# Patient Record
Sex: Male | Born: 1960 | Race: White | Hispanic: No | Marital: Married | Smoking: Never smoker
Health system: Southern US, Community
[De-identification: ages and names within clinical notes are randomized; demographics above are authoritative.]

## PROBLEM LIST (undated history)

## (undated) DIAGNOSIS — R7303 Prediabetes: Secondary | ICD-10-CM

## (undated) DIAGNOSIS — G4730 Sleep apnea, unspecified: Secondary | ICD-10-CM

## (undated) DIAGNOSIS — C8590 Non-Hodgkin lymphoma, unspecified, unspecified site: Secondary | ICD-10-CM

---

## 2004-03-30 ENCOUNTER — Encounter: Admission: RE | Admit: 2004-03-30 | Discharge: 2004-03-30 | Payer: Self-pay | Admitting: Family Medicine
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
## 2008-05-02 ENCOUNTER — Emergency Department (HOSPITAL_COMMUNITY): Admission: EM | Admit: 2008-05-02 | Discharge: 2008-05-03 | Payer: Self-pay | Admitting: Emergency Medicine

## 2010-06-18 IMAGING — CR DG CHEST 1V PORT
1 series · 1 of 1 positions shown · non-contrast
Comparison: Portable exam 7772 hours without priors for comparison.

CLINICAL DATA: Chest pain

PORTABLE CHEST - 1 VIEW

[AP]
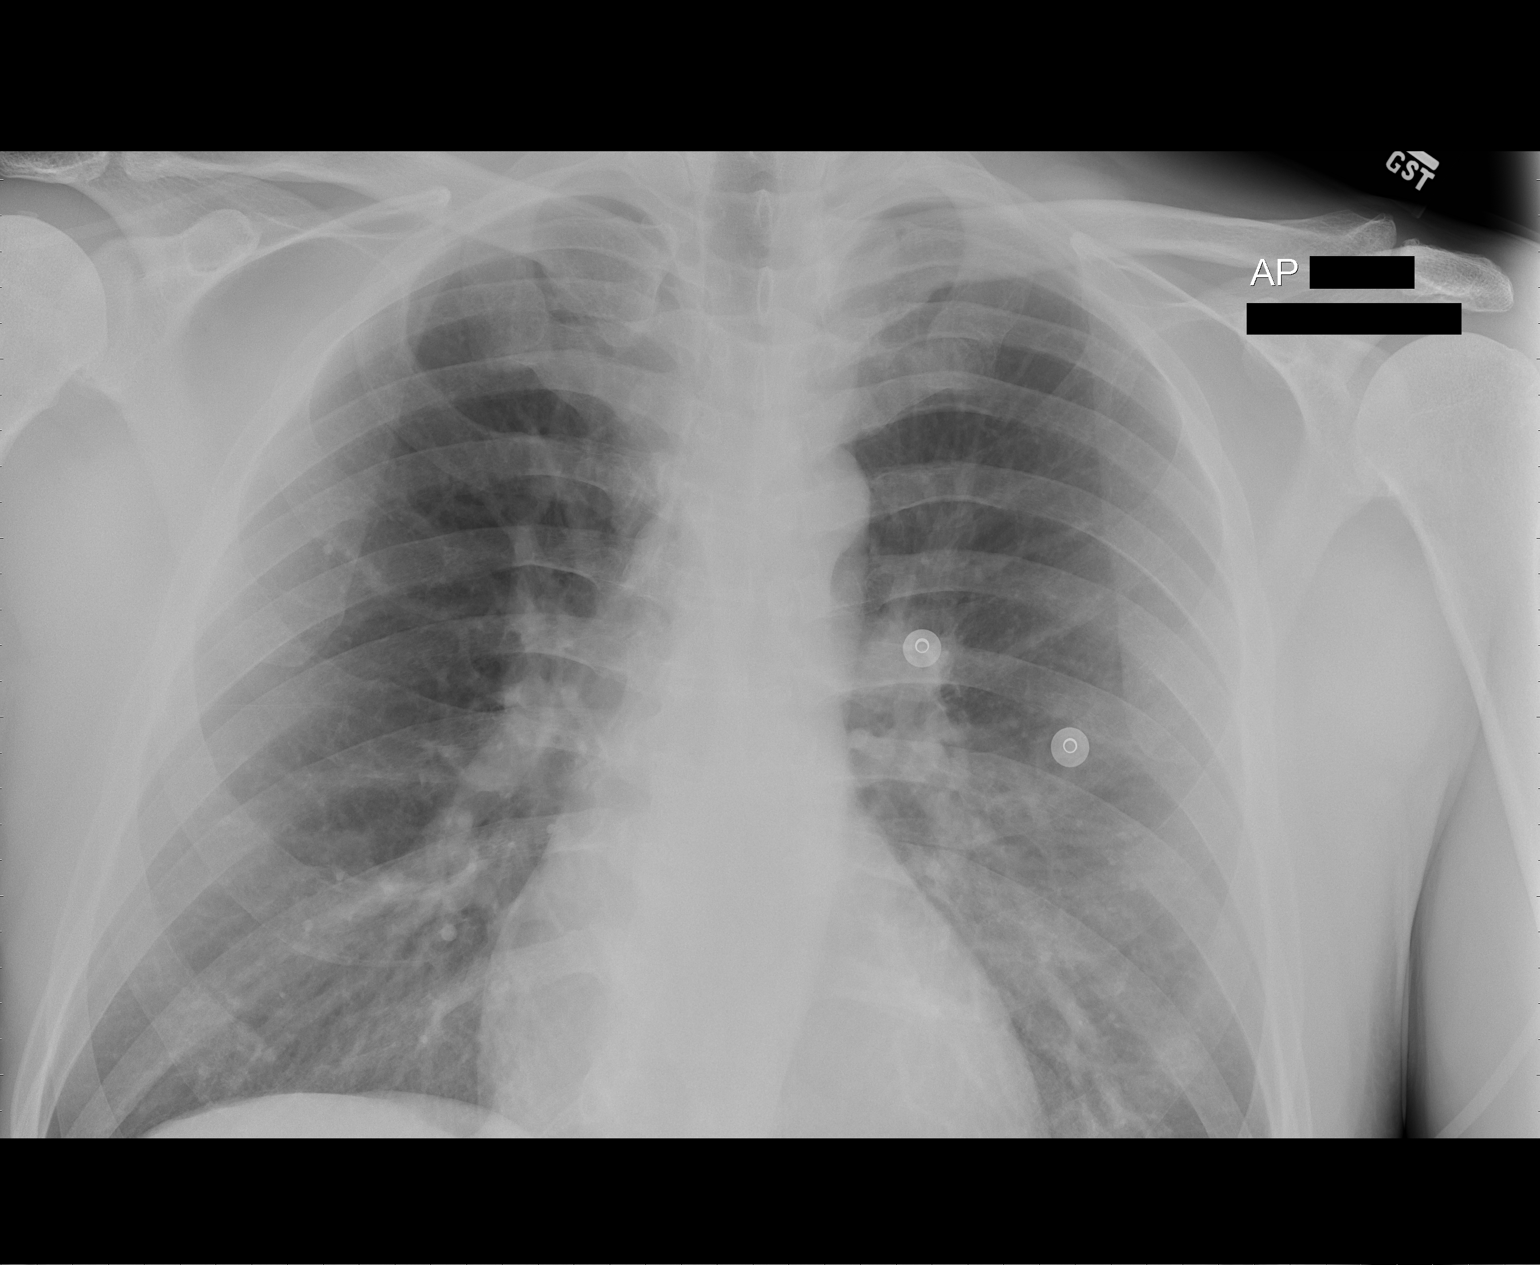

[1 of 1 positions shown; findings below may reference images not displayed]

FINDINGS: Normal heart size, mediastinal contours, and pulmonary vascularity
for technique.
Lungs clear.
No pleural effusion or pneumothorax.
IMPRESSION: No acute abnormalities.

## 2011-04-07 ENCOUNTER — Emergency Department (INDEPENDENT_AMBULATORY_CARE_PROVIDER_SITE_OTHER): Payer: BC Managed Care – PPO

## 2011-04-07 ENCOUNTER — Encounter: Payer: Self-pay | Admitting: Emergency Medicine

## 2011-04-07 ENCOUNTER — Other Ambulatory Visit: Payer: Self-pay

## 2011-04-07 ENCOUNTER — Emergency Department (HOSPITAL_BASED_OUTPATIENT_CLINIC_OR_DEPARTMENT_OTHER)
Admission: EM | Admit: 2011-04-07 | Discharge: 2011-04-07 | Disposition: A | Discharge status: Home / Self Care | Payer: BC Managed Care – PPO | Attending: Emergency Medicine | Admitting: Emergency Medicine

## 2011-04-07 DIAGNOSIS — Z79899 Other long term (current) drug therapy: Secondary | ICD-10-CM | POA: Insufficient documentation

## 2011-04-07 DIAGNOSIS — R5381 Other malaise: Secondary | ICD-10-CM | POA: Insufficient documentation

## 2011-04-07 DIAGNOSIS — R112 Nausea with vomiting, unspecified: Secondary | ICD-10-CM

## 2011-04-07 DIAGNOSIS — R55 Syncope and collapse: Secondary | ICD-10-CM | POA: Insufficient documentation

## 2011-04-07 DIAGNOSIS — K5289 Other specified noninfective gastroenteritis and colitis: Secondary | ICD-10-CM

## 2011-04-07 HISTORY — DX: Prediabetes: R73.03

## 2011-04-07 LAB — DIFFERENTIAL
Basophils Absolute: 0 10*3/uL (ref 0.0–0.1)
Eosinophils Relative: 1 % (ref 0–5)
Lymphocytes Relative: 12 % (ref 12–46)
Lymphs Abs: 1.1 10*3/uL (ref 0.7–4.0)
Monocytes Relative: 4 % (ref 3–12)
Neutro Abs: 8.1 10*3/uL — ABNORMAL HIGH (ref 1.7–7.7)

## 2011-04-07 LAB — COMPREHENSIVE METABOLIC PANEL
ALT: 22 U/L (ref 0–53)
AST: 22 U/L (ref 0–37)
Creatinine, Ser: 0.7 mg/dL (ref 0.50–1.35)
Potassium: 3.8 mEq/L (ref 3.5–5.1)
Sodium: 140 mEq/L (ref 135–145)
Total Bilirubin: 2.2 mg/dL — ABNORMAL HIGH (ref 0.3–1.2)

## 2011-04-07 LAB — CBC
Hemoglobin: 14.7 g/dL (ref 13.0–17.0)
MCH: 30.9 pg (ref 26.0–34.0)

## 2011-04-07 MED ORDER — ONDANSETRON HCL 4 MG/2ML IJ SOLN
4.0000 mg | Freq: Once | INTRAMUSCULAR | Status: AC
  Administered 2011-04-07: 4 mg via INTRAVENOUS

## 2011-04-07 MED ORDER — SODIUM CHLORIDE 0.9 % IV SOLN
Freq: Once | INTRAVENOUS | Status: AC
  Administered 2011-04-07: 20:00:00 via INTRAVENOUS

## 2011-04-07 MED ORDER — ONDANSETRON HCL 4 MG PO TABS: 4.0000 mg | ORAL_TABLET | Freq: Four times a day (QID) | ORAL | Status: AC

## 2011-04-07 MED ORDER — ONDANSETRON HCL 4 MG/2ML IJ SOLN
INTRAMUSCULAR | Status: AC
  Filled 2011-04-07: qty 2

## 2011-04-07 NOTE — ED Notes (Signed)
Water given to drink per pt request in order to provide urine sample

## 2011-04-07 NOTE — ED Provider Notes (Signed)
History     CSN: [id number] Arrival date & time: 04/07/2011  5:38 PM Scribed for Eric Cooper III, MD, the patient was seen in room MHT14/MHT14. This chart was scribed by Katha Cabal. This patient's care was started at 6:54PM.   Chief Complaint  Patient presents with  . Weakness  . Near Syncope   HPI Eric Richard is a 50 y.o. male who presents to the Emergency Department complaining of N/V onset this am with associated sinus headache, slight sore throat, weakness, light headed, "wobbly" gait. Pt tolerated breakfast.  Denies fever, ear ache, coughing, chest pain, chest palpitations, diarrhea, dysuria, rash, tick bite, recent sick contacts.  Pt adds that he traveled to [place] a couple weeks ago and ate ginger chicken at [employer].  Denies fever ear ache, coughing, chest pain, chest palpitations, diarrhea, dysuria, rash, tick bite, recent sick contacts.      HPI ELEMENTS:  Onset: this am Duration: persistent since onset Modifying factors: Aggravating by standing Context: as above  Associated symptoms: sinus headache, slight sore throat, weakness, light headed, "wobbly" gait. Pt tolerated breakfast.  Denies frebrile, ear ache, coughing, chest pain, chest palpitations, diarrhea, dysuria, rash, tick bite, recent sick contacts  PAST MEDICAL HISTORY:  Past Medical History  Diagnosis Date  . Borderline diabetes     PAST SURGICAL HISTORY:  History reviewed. No pertinent past surgical history.  MEDICATIONS:  Previous Medications   AMINO ACIDS (AMINO ACID PO)    Take 1 tablet by mouth daily.     CROMOLYN (NASALCROM) 5.2 MG/ACT NASAL SPRAY    Place 1 spray into the nose 2 (two) times daily.     MULTIPLE VITAMIN (MULTIVITAMIN) TABLET    Take 1 tablet by mouth daily.     PSEUDOEPHEDRINE (SUDAFED) 60 MG TABLET    Take 60 mg by mouth every 4 (four) hours as needed. congestion    SAW PALMETTO 500 MG CAPSULE    Take 500 mg by mouth daily.       ALLERGIES:  Allergies as of  04/07/2011  . (No Known Allergies)     FAMILY HISTORY:  Family History  Problem Relation Age of Onset  . Heart attack Father      SOCIAL HISTORY: History   Social History  . Marital Status: Married    Spouse Name: N/A    Number of Children: N/A  . Years of Education: N/A   Social History Main Topics  . Smoking status: Never Smoker   . Smokeless tobacco: None  . Alcohol Use: Yes     social  . Drug Use: No  . Sexually Active:    Other Topics Concern  . None   Social History Narrative  . None     Review of Systems 10 Systems reviewed and are negative for acute change except as noted in the HPI.  Physical Exam  BP 135/86  Pulse 60  Temp(Src) 97.8 F (36.6 C) (Oral)  Resp 20  SpO2 98%  Physical Exam  Nursing note and vitals reviewed. Constitutional: He is oriented to person, place, and time. He appears well-developed and well-nourished. No distress.  HENT:  Head: Normocephalic and atraumatic.  Eyes: EOM are normal. Pupils are equal, round, and reactive to light.       No Nystagmus.   Neck: Normal range of motion. Neck supple.  Cardiovascular: Normal rate, regular rhythm and normal heart sounds.   No murmur heard. Pulmonary/Chest: Effort normal and breath sounds normal. He has no wheezes.  Abdominal:  Soft. Bowel sounds are normal. There is tenderness (diffusely tender). There is no rebound, no guarding and no CVA tenderness.  Musculoskeletal: Normal range of motion.  Neurological: He is alert and oriented to person, place, and time. No sensory deficit.  Reflex Scores:      Tricep reflexes are 2+ on the right side and 2+ on the left side.      Bicep reflexes are 2+ on the right side and 2+ on the left side.      Brachioradialis reflexes are 2+ on the right side and 2+ on the left side.      Patellar reflexes are 2+ on the right side and 2+ on the left side.      Achilles reflexes are 2+ on the right side and 2+ on the left side. Skin: Skin is warm and dry.    Psychiatric: He has a normal mood and affect. His behavior is normal.    ED Course  Procedures   OTHER DATA REVIEWED: Nursing notes, vital signs, and past medical records reviewed.    DIAGNOSTIC STUDIES: Oxygen Saturation is 98% on room air, normal by my interpretation.     Date: 04/07/2011  Rate: 62  Rhythm: normal sinus rhythm  QRS Axis: normal  Intervals: normal  ST/T Wave abnormalities: normal  Conduction Disutrbances: Incomplete right bundle branch block.  Narrative Interpretation: Borderline EKG.  Old EKG Reviewed: no change.  LABS / RADIOLOGY: Results for orders placed during the hospital encounter of 04/07/11  CBC      Component Value Range   WBC 9.7  4.0 - 10.5 (K/uL)   RBC 4.76  4.22 - 5.81 (MIL/uL)   Hemoglobin 14.7  13.0 - 17.0 (g/dL)   HCT 13.0  86.5 - 78.4 (%)   MCV 84.7  78.0 - 100.0 (fL)   MCH 30.9  26.0 - 34.0 (pg)   MCHC 36.5 (*) 30.0 - 36.0 (g/dL)   RDW 69.6  29.5 - 28.4 (%)   Platelets 195  150 - 400 (K/uL)  DIFFERENTIAL      Component Value Range   Neutrophils Relative 83 (*) 43 - 77 (%)   Neutro Abs 8.1 (*) 1.7 - 7.7 (K/uL)   Lymphocytes Relative 12  12 - 46 (%)   Lymphs Abs 1.1  0.7 - 4.0 (K/uL)   Monocytes Relative 4  3 - 12 (%)   Monocytes Absolute 0.4  0.1 - 1.0 (K/uL)   Eosinophils Relative 1  0 - 5 (%)   Eosinophils Absolute 0.1  0.0 - 0.7 (K/uL)   Basophils Relative 0  0 - 1 (%)   Basophils Absolute 0.0  0.0 - 0.1 (K/uL)  COMPREHENSIVE METABOLIC PANEL      Component Value Range   Sodium 140  135 - 145 (mEq/L)   Potassium 3.8  3.5 - 5.1 (mEq/L)   Chloride 103  96 - 112 (mEq/L)   CO2 27  19 - 32 (mEq/L)   Glucose, Bld 137 (*) 70 - 99 (mg/dL)   BUN 12  6 - 23 (mg/dL)   Creatinine, Ser 1.32  0.50 - 1.35 (mg/dL)   Calcium 9.5  8.4 - 44.0 (mg/dL)   Total Protein 7.2  6.0 - 8.3 (g/dL)   Albumin 4.4  3.5 - 5.2 (g/dL)   AST 22  0 - 37 (U/L)   ALT 22  0 - 53 (U/L)   Alkaline Phosphatase 54  39 - 117 (U/L)   Total Bilirubin 2.2 (*)  0.3 - 1.2 (  mg/dL)   GFR calc non Af Amer >60  >60 (mL/min)   GFR calc Af Amer >60  >60 (mL/min)  LIPASE, BLOOD      Component Value Range   Lipase 17  11 - 59 (U/L)     No results found.     ED COURSE / COORDINATION OF CARE: 6:59 PM PE complete and was benign.  Order antiemetic, pain control, and blood work.        Orders Placed This Encounter  Procedures  . DG Abd Acute W/Chest  . CBC  . Differential  . Comprehensive metabolic panel  . Lipase, blood  . Urinalysis with microscopic    MDM:    IMPRESSION: Diagnoses that have been ruled out:  Diagnoses that are still under consideration:  Final diagnoses:     MEDICATIONS GIVEN IN THE E.D.    . ondansetron      . sodium chloride   Intravenous Once  . ondansetron (ZOFRAN) IV  4 mg Intravenous Once     DISCHARGE MEDICATIONS: New Prescriptions   No medications on file    I personally performed the services described in this documentation, which was scribed in my presence. The recorded information has been reviewed and considered. Osvaldo Human, MD        Eric Cooper III, MD 04/07/11 2239

## 2011-04-07 NOTE — ED Notes (Signed)
Patient is resting comfortably. No needs identified. Wife at bedside. Awaiting disposition

## 2011-04-07 NOTE — ED Notes (Signed)
Pt reports NV, weakness & dizziness that started around 9 am today, progressively worse. 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  Pt denies CP.

## 2011-04-08 LAB — URINALYSIS, ROUTINE W REFLEX MICROSCOPIC
Bilirubin Urine: NEGATIVE
Ketones, ur: NEGATIVE mg/dL
Leukocytes, UA: NEGATIVE
Nitrite: NEGATIVE
Protein, ur: NEGATIVE mg/dL
Urobilinogen, UA: 0.2 mg/dL (ref 0.0–1.0)
pH: 6.5 (ref 5.0–8.0)

## 2011-05-13 LAB — POCT I-STAT, CHEM 8
BUN: 16
Chloride: 106
Creatinine, Ser: 1.4
HCT: 42
Potassium: 3.8
Sodium: 140

## 2011-05-13 LAB — POCT CARDIAC MARKERS
CKMB, poc: <1 — ABNORMAL LOW
Myoglobin, poc: 228
Troponin i, poc: <0.05

## 2018-10-13 DIAGNOSIS — C8211 Follicular lymphoma grade II, lymph nodes of head, face, and neck: Secondary | ICD-10-CM | POA: Insufficient documentation

## 2019-11-22 DIAGNOSIS — G4733 Obstructive sleep apnea (adult) (pediatric): Secondary | ICD-10-CM | POA: Insufficient documentation

## 2020-01-02 ENCOUNTER — Emergency Department (HOSPITAL_COMMUNITY): Payer: BC Managed Care – PPO

## 2020-01-02 ENCOUNTER — Encounter (HOSPITAL_COMMUNITY): Payer: Self-pay | Admitting: Emergency Medicine

## 2020-01-02 ENCOUNTER — Other Ambulatory Visit: Payer: Self-pay

## 2020-01-02 ENCOUNTER — Emergency Department (HOSPITAL_COMMUNITY)
Admission: EM | Admit: 2020-01-02 | Discharge: 2020-01-02 | Disposition: A | Discharge status: Home / Self Care | Payer: BC Managed Care – PPO | Attending: Emergency Medicine | Admitting: Emergency Medicine

## 2020-01-02 ENCOUNTER — Telehealth: Payer: Self-pay | Admitting: Cardiology

## 2020-01-02 DIAGNOSIS — Z79899 Other long term (current) drug therapy: Secondary | ICD-10-CM | POA: Insufficient documentation

## 2020-01-02 DIAGNOSIS — R0789 Other chest pain: Secondary | ICD-10-CM | POA: Diagnosis not present

## 2020-01-02 DIAGNOSIS — R079 Chest pain, unspecified: Secondary | ICD-10-CM

## 2020-01-02 HISTORY — DX: Non-Hodgkin lymphoma, unspecified, unspecified site: C85.90

## 2020-01-02 HISTORY — DX: Sleep apnea, unspecified: G47.30

## 2020-01-02 LAB — BASIC METABOLIC PANEL
Anion gap: 8 (ref 5–15)
BUN: 10 mg/dL (ref 6–20)
CO2: 24 mmol/L (ref 22–32)
Calcium: 9.1 mg/dL (ref 8.9–10.3)
Chloride: 108 mmol/L (ref 98–111)
Creatinine, Ser: 1.1 mg/dL (ref 0.61–1.24)
GFR calc Af Amer: >60 mL/min (ref >60)
GFR calc non Af Amer: >60 mL/min (ref >60)
Glucose, Bld: 109 mg/dL — ABNORMAL HIGH (ref 70–99)
Potassium: 3.9 mmol/L (ref 3.5–5.1)
Sodium: 140 mmol/L (ref 135–145)

## 2020-01-02 LAB — CBC
HCT: 41.3 % (ref 39.0–52.0)
Hemoglobin: 14.4 g/dL (ref 13.0–17.0)
MCH: 31.3 pg (ref 26.0–34.0)
MCHC: 34.9 g/dL (ref 30.0–36.0)
MCV: 89.8 fL (ref 80.0–100.0)
Platelets: 184 10*3/uL (ref 150–400)
RBC: 4.6 MIL/uL (ref 4.22–5.81)
RDW: 11.8 % (ref 11.5–15.5)
WBC: 8.8 10*3/uL (ref 4.0–10.5)
nRBC: 0 % (ref 0.0–0.2)

## 2020-01-02 LAB — TROPONIN I (HIGH SENSITIVITY)
Troponin I (High Sensitivity): 17 ng/L (ref <18)
Troponin I (High Sensitivity): 20 ng/L — ABNORMAL HIGH (ref <18)

## 2020-01-02 MED ORDER — SODIUM CHLORIDE 0.9% FLUSH: 3.0000 mL | Freq: Once | INTRAVENOUS | Status: DC

## 2020-01-02 NOTE — ED Provider Notes (Signed)
[name] DEPART[facility] Provider Note   CSN: [id number] Arrival date & time: 01/02/20  1722     History Chief Complaint  Patient presents with  . Chest Pain    Eric Richard is a 59 y.o. male.  59 year old male with prior medical history as detailed below presents for evaluation of chest discomfort.  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  Patient reports that he is followed by cardiology at [facility].  He is in the process of getting a work-up for these coronary artery calcifications.  He reports that he is being scheduled for a coronary ultrasound this week for further evaluation.  He reports intermittent episodes of the last several days of left-sided chest discomfort.  Patient's symptoms seem to be somewhat associated with exertion while biking.  He is currently comfortable.  He denies associated nausea or shortness of breath.  He denies current chest pain.  He describes his discomfort as a dull left-sided " ache."  Patient reports taking a full dose aspirin prior to arrival today.  The history is provided by the patient and medical records.  Chest Pain Pain location:  L chest Pain quality: aching   Pain radiates to:  Does not radiate Pain severity:  No pain Onset quality:  Unable to specify Timing:  Intermittent Progression:  Waxing and waning Relieved by:  Nothing Worsened by:  Nothing      Past Medical History:  Diagnosis Date  . Borderline diabetes   . Lymphoma ([place])   . Sleep apnea     There are no problems to display for this patient.   No past surgical history on file.     Family History  Problem Relation Age of Onset  . Heart attack Father     Social History   Tobacco Use  . Smoking status: Never Smoker  Substance Use Topics  . Alcohol use: Yes    Comment: social  . Drug use: No    Home Medications Prior to Admission medications   Medication Sig Start Date  End Date Taking? Authorizing Provider  Amino Acids (AMINO ACID PO) Take 1 tablet by mouth daily.      [provider]  cromolyn (NASALCROM) 5.2 MG/ACT nasal spray Place 1 spray into the nose 2 (two) times daily.      [provider]  Multiple Vitamin (MULTIVITAMIN) tablet Take 1 tablet by mouth daily.      [provider]  pseudoephedrine (SUDAFED) 60 MG tablet Take 60 mg by mouth every 4 (four) hours as needed. congestion     [provider]  saw palmetto 500 MG capsule Take 500 mg by mouth daily.      [provider]    Allergies    Patient has no known allergies.  Review of Systems   Review of Systems  Cardiovascular: Positive for chest pain.  All other systems reviewed and are negative.   Physical Exam Updated Vital Signs BP (!) 140/96 (BP Location: Left Arm)   Pulse 68   Temp 98.6 F (37 C) (Oral)   Resp 16   Ht 5\' 8"  (1.727 m)   Wt 83.9 kg   SpO2 100%   BMI 28.13 kg/m   Physical Exam Vitals and nursing note reviewed.  Constitutional:      General: He is not in acute distress.    Appearance: He is well-developed.  HENT:     Head: Normocephalic and atraumatic.  Eyes:  Conjunctiva/sclera: Conjunctivae normal.     Pupils: Pupils are equal, round, and reactive to light.  Cardiovascular:     Rate and Rhythm: Normal rate and regular rhythm.     Heart sounds: Normal heart sounds.  Pulmonary:     Effort: Pulmonary effort is normal. No respiratory distress.     Breath sounds: Normal breath sounds.  Abdominal:     General: There is no distension.     Palpations: Abdomen is soft.     Tenderness: There is no abdominal tenderness.  Musculoskeletal:        General: No deformity. Normal range of motion.     Cervical back: Normal range of motion and neck supple.  Skin:    General: Skin is warm and dry.  Neurological:     Mental Status: He is alert and oriented to person, place, and time.     ED Results / Procedures /  Treatments   Labs (all labs ordered are listed, but only abnormal results are displayed) Labs Reviewed  BASIC METABOLIC PANEL - Abnormal; Notable for the following components:      Result Value   Glucose, Bld 109 (*)    All other components within normal limits  CBC  TROPONIN I (HIGH SENSITIVITY)  TROPONIN I (HIGH SENSITIVITY)    EKG EKG Interpretation  Date/Time:  Sunday Jan 02 2020 17:51:59 EDT Ventricular Rate:  67 PR Interval:  156 QRS Duration: 86 QT Interval:  412 QTC Calculation: 435 R Axis:   75 Text Interpretation: Normal sinus rhythm Anteroseptal infarct , age undetermined Abnormal ECG Confirmed by Dene Gentry [phone]) on 01/02/2020 6:40:12 PM   Radiology DG Chest 2 View  Result Date: 01/02/2020 CLINICAL DATA:  Dizziness, left-sided chest pressure since this morning, nausea EXAM: CHEST - 2 VIEW COMPARISON:  05/03/2008 FINDINGS: The heart size and mediastinal contours are within normal limits. Both lungs are clear. The visualized skeletal structures are unremarkable. IMPRESSION: No active cardiopulmonary disease. Electronically Signed   By: Randa Ngo M.D.   On: 01/02/2020 18:36    Procedures Procedures (including critical care time)  Medications Ordered in ED Medications  sodium chloride flush (NS) 0.9 % injection 3 mL (has no administration in time range)    ED Course  I have reviewed the triage vital signs and the nursing notes.  Pertinent labs & imaging results that were available during my care of the patient were reviewed by me and considered in my medical decision making (see chart for details).    MDM Rules/Calculators/A&P                      MDM  Screen complete  Eric Richard was evaluated in Emergency Department on 01/02/2020 for the symptoms described in the history of present illness. He was evaluated in the context of the global COVID-19 pandemic, which necessitated consideration that the patient might be at risk for infection with  the SARS-CoV-2 virus that causes COVID-19. Institutional protocols and algorithms that pertain to the evaluation of patients at risk for COVID-19 are in a state of rapid change based on information released by regulatory bodies including the CDC and federal and state organizations. These policies and algorithms were followed during the patient's care in the ED.  Patient is presenting for evaluation of left-sided chest discomfort.  Patient is reporting a prior history of coronary artery calcifications found on CT.  Patient is in process of getting this worked up with [facility] cardiology.  Work-up  today is without evidence of significant abnormality.  Initial EKG is without evidence of acute ischemia.  Initial troponin is 17.  Second troponin is pending at time of sign out.  Patient's case discussed with covering ED provider Gilford Raid) who will follow-up on second troponin and disposition.   Final Clinical Impression(s) / ED Diagnoses Final diagnoses:  Chest pain, unspecified type    Rx / DC Orders ED Discharge Orders    None       Valarie Merino, MD 01/02/20 2112

## 2020-01-02 NOTE — ED Triage Notes (Signed)
C/o dizziness and L sided chest pressure since this morning with nausea.  Seen by cardiologist on Friday and has "thoracic [place]" ordered this week.  States cardiologist told him to go to ED if he had anymore events prior to the [place].

## 2020-01-02 NOTE — ED Provider Notes (Signed)
Pt signed out by Dr. Francia Greaves.  Pt's 2nd troponin 20 which is slightly higher than 17 which was his initial, but I don't think it is high enough to worry.  The pt is offered admission, but he wants to go home.  I don't think he needs to stay.  Heart score is 2.  Pt has an appointment with his cardiologist on Wed, May 26.  Pt knows to return if worse.    Isla Pence, MD 01/02/20 2322

## 2020-01-02 NOTE — Telephone Encounter (Signed)
Pt called in reporting he had an EKG done at his cardiologist Friday [facility]) states he is not feeling well today. Called [facility] on call and told to go to the ED. He has never been seen by [facility] in the past. I recommended that he go where his established cardiologist is located. He stated he wanted to go to a location that was closest to him which would be [facility]. I advised that was his choice but the ER should be able to access records. He voiced understanding at the end of the call.

## 2020-01-07 DIAGNOSIS — I200 Unstable angina: Secondary | ICD-10-CM | POA: Insufficient documentation

## 2021-08-07 ENCOUNTER — Ambulatory Visit (HOSPITAL_BASED_OUTPATIENT_CLINIC_OR_DEPARTMENT_OTHER): Payer: Self-pay

## 2021-08-09 ENCOUNTER — Encounter (HOSPITAL_BASED_OUTPATIENT_CLINIC_OR_DEPARTMENT_OTHER): Payer: Self-pay | Admitting: Cardiovascular Disease

## 2021-08-09 ENCOUNTER — Other Ambulatory Visit: Payer: Self-pay

## 2021-08-09 ENCOUNTER — Ambulatory Visit: Payer: BLUE CROSS/BLUE SHIELD | Attending: Cardiovascular Disease | Admitting: Cardiovascular Disease

## 2021-08-09 VITALS — BP 131/85 | HR 51 | Temp 97.7°F | Ht 68.0 in | Wt 169.9 lb

## 2021-08-09 DIAGNOSIS — I25118 Atherosclerotic heart disease of native coronary artery with other forms of angina pectoris: Secondary | ICD-10-CM | POA: Insufficient documentation

## 2021-08-09 NOTE — Nursing Note (Signed)
Pre-Procedure Instructions for Percutaneous Coronary Intervention     Thank you for selecting [facility]) for your procedure.     Our address: [place]    Procedure Date and Check-in Information    Date:  December 30                                 Check-in Time:  12:00 pm ( noon)     Check-in Location: PACIFIC ADMITING check in - 2nd Floor   Address: [address] [place] [address]       Use the main hospital entrance on [address].   Once you enter the main lobby of [facility], turn right and take the  [address] to [address] down to the 2nd floor. Turn left as you exit the elevator.    Check in at [facility] .    General Instructions       ? PLEASE BRING A CURRENT LIST OF ALL MEDICATIONS THE DAY OF PROCEDURE  ?  Do not eat or drink 6 hours prior to your check in time. You may take sips of water until 2 hours prior to the check in time.   ? Do not eat high fat foods such as bacon,sausage, or eggs for at least 8 hrs before your check in time .   ? Please take your medications as prescribed INCLUDING Aspirin    ? Avoid any other over the counter supplements at least 5 days prior to procedure.      ? If you use a CPAP machine at home, please bring this with you to use during the procedure and for your overnight stay.    ? Follow up appointments must be scheduled with your primary cardiologist within 2-4 weeks after your procedure. Please direct any medication refill requests to your primary cardiologist and procedure records and images will be sent directly to the referring cardiologist approximately 1 week after your procedure.   ? Most patients typically spend one night in the hospital after the procedure then Bridgit Eynon home the next morning. You will need someone to drive you home. Please wait 24-48 hrs after procedure before driving.  You cannot drive yourself home or take a bus, taxi or shuttle by yourself.      If you have any clinical  questions regarding your procedure, please call [facility] at [phone].  Our afterhours number is [phone].   _______________________________________________________________________________    PRE CATH SCREENING QUESTIONS :      YES NO Notes    []   []      Any COVID related Symptoms? []   [x]      Any travel (Domestic or international) in the last month? [x]   []      Any Allergy to contrast dye/iodine?   If yes: Pretreatment ordered? YES []   NO []   []   [x]      History or suspected OSA? (Snore, Tired, Apnea, HTN, BMI>35, Age >78, Male) [x]   []      Uses/Compliant with CPAP?   If yes: Bring CPAP  If no: Consult Anesthesia on day of procedure  [x]   []      Home O2? []   [x]      History of COPD?  If with advanced lung disease on Home O2, consult Anesthesia on day of procedure. []   [x]      Able to walk 2 blocks or  climb 2 flights of stairs without getting SOB? [x]   []      Able to lay flat without getting SOB? [x]   []      Any problems with sedation or anesthesia in the past? []   [x]      Have you been told by an anesthesiologist that you have a difficult airway or that they consider you to be a difficult intubation? []   [x]      Long term opioids for chronic pain? []   [x]      EF >20%? []   [x]   Most Recent EF:55-60%   ESRD? []   [x]      ESLD? []   [x]      Pulmonary HTN? (PAS >41mmHg) []   [x]      Neurological or neuromuscular disorders compromising respiratory or swallowing functions? []   [x]      Are you taking any Antiplatelets?  ASA, Clopidogrel (Plavix), prasugrel (Effient), Ticagrelor (Brilinta) [x]   []   ASA   Are you taking any Anticoagulants?  Warfarin (Coumadin), dabigatran (Pradaxa), apixaban (Eliquis), rivaroxaban (xarelto) []   [x]      Are you taking any Diabetic medications?  Insulin, Metformin []   [x]      Are you taking any diuretics? []   [x]      Have you fallen in the last 12 months? []   [x]      Do you use any assistive devices with ambulation?  Cane, walker, wheelchair []   [x]      Do you have a  reliable ride home? [x]   []   With wife . Visitor policy reviewed          Wt-77.1 kg       Ht-   5'8"     BMI-25.12       BMI 40-45 = sedating RN to assess ( if anesthesia needed , will be scheduled day of)      BMI > 45 = anesthesia required ( schedule w/ pre-anesthesia clinic)        Screening Tool for Moderate Sedation Anesthesia Consultation      Absolute Contraindications: Schedule Procedure with Anesthesia!    []  Morbid Obesity BMI >45    []  Pregnancy >[redacted] weeks gestation    []  Previous adverse reaction to sedation / anesthesia    []  Known difficult airway for ventilation or intubation     []  Neurological or neuromuscular disorders compromising respiratory or swallowing function     []  ASA Class V                    Mallampati Class III & IV = Potential difficult intubation (Consider anesthesia consult)    Mandatory Consults: If Consult indicated: Consult will be done day of procedure.     []  OSA non-compliant with CPAP treatment    []  BMI 40-45 (flag for on arrival stop bang) and STOP-BANG score >4    []  Advanced lung disease on home oxygen    []  Severe Aortic Stenosis (Valve Area <0.8 cm2)  =  EXCEPTION for CATH PROCEDURES     []  Unintubated TBI (GCS <15: proceed with caution, GCS 10-12: Anesthesia Consult    []  End-Stage Renal Failure with:  Known OSA and non-compliant with CPAP         OR Obesity (BMI 40-45 with STOP-BANG >4) OR Diabetes on insulin infusion    []  End-Stage Liver Disease with:  Encephalopathy requiring treatment          OR #2 of the following:  Childs C, MELD >15, Na <128  SCREENING OUTCOME:     []  Sedating RN to assess    []  Cleared for Moderate Sedation       []  Anesthesia Required    []  Anesthesia Consult Required

## 2021-08-09 NOTE — Progress Notes (Signed)
[facility]  Interventional Cardiology - New Patient Visit     Evan Sprinkles, MD  [address] [place] [address]  Office: [phone]  Fax: [phone]     Referring provider: Self    Chief Complaint/Identifying Information:  Evan Trevino is a 60 year old male with a history of CAD who presents to [facility] for consideration of LAD CTO PCI.     History of Presenting Illness:  He is a very fit 60 year old male who enjoys biking and has noticed a progressive decline in his endurance over the last couple of years.  He has a history of follicular lymphoma in remission and had a surveillance PET scan that demonstrated heavy coronary calcium.  He eventually underwent a stress test that was positive and referred for angiogram.  Cath in 2021 demonstrated the LAD CTO and he has been medically managed since.    Unfortunately he has had side effects from the metoprolol and poor control of his symptoms.  He has gradually declined in his ability to bike and is not content with his current functional status.  He is interested and eager to hear about other approaches.    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      cholecalciferol (Vitamin D-3) 125 MCG (5000 UT) Take 5,000 units by mouth daily.      cromolyn 5.2 MG/ACT nasal spray Place 1 spray in the nostril as needed.      Magnesium Gluconate 550 MG tablet Take 30 mg by mouth daily as needed.      metoprolol succinate ER 25 MG  24 hr tablet Take 25 mg by mouth daily.      Multiple Vitamin (Quintabs) tablet Take 1 tablet by mouth.      nitroGLYCERIN 0.4 MG SL tablet Place 0.4 mg under the tongue every 5 minutes as needed for chest pain. Max 3 doses per 15 min. If no relief, call 911.      Quercetin Dihydrate powder by Other route daily.      Saw Palmetto 500 MG capsule Take 500 mg by mouth daily.         Social History:  Lives in [place], here on vacation  Does not smoke, rides bike for exercise    Review of Systems:  A comprehensive review of systems was performed and is negative unless mentioned in HPI.    Physical Exam:  There were no vitals taken for this visit.  General: Well-appearing  in no distress.  HEENT: Neck veins flat  Respiratory: Clear to auscultation bilaterally without wheezes, rales or rhonchi  Cardiovascular: Regular rate and rhythm without murmurs, rubs or gallop  Extremities: no edema  Neuro/Psych: Mood and affect appropriate    Labs:  No results found for this or any previous visit.  No results found for this or any previous visit.    Studies Reviewed and discussed with the  patient:   Transthoracic Echocardiogram:  By report, normal LV function without significant valvular disease and normal pulmonary artery pressure.    Angiogram:  By report, proximal LAD CTO with robust right to left collateralization  No other significant lesions    Assessment:  Evan Trevino is a 60 year old male with a history of LAD CTO who presents to [facility] for consideration of CTO revasc in the setting of CCS II angina and poor tolerance of antianginal therapy.  He is not content with his functional status and would like to try an invasive approach to manage his symptoms.  We discussed the three major modes of therapy to include medical management, surgical revasc, and CTO PCI.  He is most interested in PCI.    We have discussed with the patient the benefits of percutaneous revascularization on symptoms  vs. risks of procedure including but not limited to stroke, death, need for emergent surgery, myocardial Infarction, radiation induced skin injuries, contrast induced nephropathy, increased vascular complications due to bilateral large bore access.  Other risks including increased risk of coronary perforation, retrograde specific complications including but not limited to donor vessel thrombus/dissection, collateral perforation and retrograde gear entrapment have been discussed.  The patient understands risks and benefits and wishes to proceed forth.     Plan:  Procedure: CTO PCI of proximal LAD  Contrast allergy:  No  Moderate sedation concerns? NO   Anesthesia required? NO  Access: Radial/Femoral  Anticoagulation:   Hold anticoag: N/A  Anti-platelet plan: Aspirin and clopidogrel (will need to be loaded prior to procedure)     Patient seen, staffed and care plan developed with Dr. Alwyn Ren and Dr. Lonia Farber.     Demetria Pore, MD  Interventional Cardiology Fellow  [facility]

## 2021-08-09 NOTE — Telephone Encounter (Signed)
Call Type:  Triage Call    [place] Name:  [place] Name:  [place] [address]    Presenting Problem:  Chest Pain / Chest Discomfort - chest pain, pressure refused 911 statement    Assessment:  01. Symptoms: What are your symptoms? chest pain, history of heart issues chronic LAD occlusion, dizziness, irregular heart rate, chest pain, weakness  02. Onset: When did your symptoms/concern begin? (minutes, hours, days, or months ago?) ongoing 3-4 WEEKS  03. Action: What actions or treatments have you taken to treat your symptoms/concern? UTO  04. Response: What was the response to your actions taken? UTO  05. Severity: How severe are your symptoms? (ex. interference with normal activities, pain scale, size or appearance of injury) UTO    Triage Note:  Agrees be seen in ER.  HOSPITAL NAME: [facility]    Guideline Title:  Chest Pain (Adult)    Guideline Question:  [1] Chest pain lasts > 5 minutes AND [2] age > 75? Yes    Recommended Disposition:  Call EMS 911 Now    Original Inclination:  NA-Urgent Call    Intended Action:  GO to ED Now    Care Advice:  Care Advice given per Chest Pain (Adult) guideline.  Call EMS 911 Now:

## 2021-08-09 NOTE — Patient Instructions (Addendum)
Pre-Procedure Instructions for Percutaneous Coronary Intervention     Thank you for selecting [facility]) for your procedure.     Our address: [place]    Procedure Date and Check-in Information    Date:   December 30                                 Check-in Time:  12:00 pm ( noon)     Check-in Location: PACIFIC ADMITING check in - 2nd Floor   Address: [address] [place] [address]      Use the main hospital entrance on [address]  Once you enter the main lobby of [facility], turn right and take the  [place] [address] [place] [address] down to the 2nd floor. Turn left as you exit the elevator.   Check in at [place] .    General Instructions       PLEASE BRING A CURRENT LIST OF ALL MEDICATIONS THE DAY OF PROCEDURE   Do not eat or drink 6 hours prior to your check in time. You may take sips of water until 2 hours prior to the check in time.   Do not eat high fat foods such as bacon,sausage, or eggs for at least 8 hrs before your check in time .   Please take your medications as prescribed INCLUDING Aspirin.  Avoid any other over the counter supplements at least 5 days prior to procedure.        If you use a CPAP machine at home, please bring this with you to use during the procedure and for your overnight stay.    Follow up appointments must be scheduled with your primary cardiologist within 2-4 weeks after your procedure. Please direct any medication refill requests to your primary cardiologist and procedure records and images will be sent directly to the referring cardiologist approximately 1 week after your procedure.   Most patients typically spend one night in the hospital after the procedure then [place] Vandalen home the next morning. You will need someone to drive you home. Please wait 24-48 hrs after procedure before driving.  You cannot drive yourself home or take a bus, taxi or shuttle by yourself.      If you have any clinical questions regarding  your procedure, please call [facility] at [phone].  Our afterhours number is [phone].

## 2021-08-10 ENCOUNTER — Other Ambulatory Visit (HOSPITAL_BASED_OUTPATIENT_CLINIC_OR_DEPARTMENT_OTHER): Payer: BLUE CROSS/BLUE SHIELD

## 2021-08-10 ENCOUNTER — Encounter (HOSPITAL_COMMUNITY)
Admission: RE | Disposition: A | Discharge status: Home / Self Care | Payer: Self-pay | Source: Home / Self Care | Attending: Cardiovascular Disease

## 2021-08-10 ENCOUNTER — Other Ambulatory Visit: Payer: Self-pay

## 2021-08-10 ENCOUNTER — Other Ambulatory Visit (HOSPITAL_BASED_OUTPATIENT_CLINIC_OR_DEPARTMENT_OTHER): Payer: Self-pay

## 2021-08-10 ENCOUNTER — Observation Stay (HOSPITAL_COMMUNITY): Payer: Self-pay | Admitting: Cardiovascular Disease

## 2021-08-10 ENCOUNTER — Encounter (HOSPITAL_BASED_OUTPATIENT_CLINIC_OR_DEPARTMENT_OTHER): Payer: Self-pay | Admitting: Physician Assistant

## 2021-08-10 ENCOUNTER — Observation Stay
Admission: RE | Admit: 2021-08-10 | Discharge: 2021-08-11 | Disposition: A | Discharge status: Home / Self Care | Payer: BLUE CROSS/BLUE SHIELD | Attending: Cardiovascular Disease | Admitting: Cardiovascular Disease

## 2021-08-10 DIAGNOSIS — Z20822 Contact with and (suspected) exposure to covid-19: Secondary | ICD-10-CM | POA: Insufficient documentation

## 2021-08-10 DIAGNOSIS — Z01810 Encounter for preprocedural cardiovascular examination: Secondary | ICD-10-CM

## 2021-08-10 DIAGNOSIS — I2584 Coronary atherosclerosis due to calcified coronary lesion: Secondary | ICD-10-CM | POA: Insufficient documentation

## 2021-08-10 DIAGNOSIS — I4510 Unspecified right bundle-branch block: Secondary | ICD-10-CM

## 2021-08-10 DIAGNOSIS — Z955 Presence of coronary angioplasty implant and graft: Secondary | ICD-10-CM | POA: Insufficient documentation

## 2021-08-10 DIAGNOSIS — R001 Bradycardia, unspecified: Secondary | ICD-10-CM

## 2021-08-10 DIAGNOSIS — Z9889 Other specified postprocedural states: Secondary | ICD-10-CM | POA: Insufficient documentation

## 2021-08-10 DIAGNOSIS — I25118 Atherosclerotic heart disease of native coronary artery with other forms of angina pectoris: Secondary | ICD-10-CM | POA: Insufficient documentation

## 2021-08-10 DIAGNOSIS — Z8572 Personal history of non-Hodgkin lymphomas: Secondary | ICD-10-CM

## 2021-08-10 DIAGNOSIS — I2510 Atherosclerotic heart disease of native coronary artery without angina pectoris: Secondary | ICD-10-CM | POA: Insufficient documentation

## 2021-08-10 DIAGNOSIS — R9431 Abnormal electrocardiogram [ECG] [EKG]: Secondary | ICD-10-CM

## 2021-08-10 LAB — PROTHROMBIN TIME
Prothrombin INR: 1.2 (ref 0.8–1.3)
Prothrombin Time Patient: 15.1 s (ref 10.7–15.6)

## 2021-08-10 LAB — CBC (HEMOGRAM)
Hematocrit: 39 % (ref 38.0–50.0)
Hemoglobin: 13.9 g/dL (ref 13.0–18.0)
MCH: 31.2 pg (ref 27.3–33.6)
MCHC: 35.3 g/dL (ref 32.2–36.5)
MCV: 88 fL (ref 81–98)
Platelet Count: 146 10*3/uL — ABNORMAL LOW (ref 150–400)
RBC: 4.46 10*6/uL (ref 4.40–5.60)
RDW-CV: 12.4 % (ref 11.0–14.5)
WBC: 5.31 10*3/uL (ref 4.3–10.0)

## 2021-08-10 LAB — BASIC METABOLIC PANEL
Anion Gap: 9 (ref 4–12)
Calcium: 9 mg/dL (ref 8.9–10.2)
Carbon Dioxide, Total: 26 meq/L (ref 22–32)
Chloride: 107 meq/L (ref 98–108)
Creatinine: 1.06 mg/dL (ref 0.51–1.18)
Glucose: 110 mg/dL (ref 62–125)
Potassium: 4.7 meq/L (ref 3.6–5.2)
Sodium: 142 meq/L (ref 135–145)
Urea Nitrogen: 12 mg/dL (ref 8–21)
eGFR by CKD-EPI 2021: >60 mL/min/{1.73_m2} (ref >59)

## 2021-08-10 SURGERY — PCI/CTO
Anesthesia: Moderate Sedation | Site: Coronary

## 2021-08-10 MED ORDER — MIDAZOLAM HCL (PF) 2 MG/2ML IJ SOLN
0.5000 mg | INTRAMUSCULAR | Status: DC | PRN
Start: 2021-08-10 — End: 2021-08-10
  Administered 2021-08-10 (×2): 1 mg via INTRAVENOUS
  Administered 2021-08-10 (×3): .5 mg via INTRAVENOUS

## 2021-08-10 MED ORDER — ACETAMINOPHEN 325 MG OR TABS
325.0000 mg | ORAL_TABLET | ORAL | Status: DC | PRN
Start: 2021-08-10 — End: 2021-08-11

## 2021-08-10 MED ORDER — NITROGLYCERIN 200 MCG/ML 10 ML SYRINGE IN D5W (PROCEDURAL)
INTRAVENOUS | Status: DC | PRN
Start: 2021-08-10 — End: 2021-08-10
  Administered 2021-08-10: 200 ug via INTRACORONARY

## 2021-08-10 MED ORDER — FENTANYL CITRATE (PF) 100 MCG/2ML IJ SOLN
25.0000 ug | INTRAMUSCULAR | Status: DC | PRN
Start: 2021-08-10 — End: 2021-08-10
  Administered 2021-08-10 (×3): 25 ug via INTRAVENOUS
  Administered 2021-08-10: 50 ug via INTRAVENOUS

## 2021-08-10 MED ORDER — POLYETHYLENE GLYCOL 3350 17 G OR PACK
17.0000 g | PACK | Freq: Every day | ORAL | Status: DC | PRN
Start: 2021-08-10 — End: 2021-08-11

## 2021-08-10 MED ORDER — LIDOCAINE HCL 2 % IJ SOLN
INTRAMUSCULAR | Status: DC | PRN
Start: 2021-08-10 — End: 2021-08-10

## 2021-08-10 MED ORDER — ASPIRIN 81 MG OR TBEC
243.0000 mg | DELAYED_RELEASE_TABLET | Freq: Every day | ORAL | Status: DC
Start: 2021-08-10 — End: 2021-08-10
  Administered 2021-08-10: 243 mg via ORAL

## 2021-08-10 MED ORDER — ATROPINE SULFATE 1 MG/10ML IJ SOSY
0.5000 mg | PREFILLED_SYRINGE | INTRAMUSCULAR | Status: DC | PRN
Start: 2021-08-10 — End: 2021-08-11

## 2021-08-10 MED ORDER — CLOPIDOGREL BISULFATE 75 MG OR TABS
600.0000 mg | ORAL_TABLET | Freq: Once | ORAL | Status: AC
Start: 2021-08-10 — End: 2021-08-10
  Administered 2021-08-10: 600 mg via ORAL

## 2021-08-10 MED ORDER — HEPARIN SODIUM (PORCINE) 1000 UNIT/ML IJ SOLN
INTRAMUSCULAR | Status: AC
Start: 2021-08-10 — End: 2021-08-10
  Filled 2021-08-10: qty 10

## 2021-08-10 MED ORDER — LIDOCAINE HCL (PF) 1 % IJ SOLN
INTRAMUSCULAR | Status: DC | PRN
Start: 2021-08-10 — End: 2021-08-10
  Administered 2021-08-10: 1 mL via SUBCUTANEOUS

## 2021-08-10 MED ORDER — CLOPIDOGREL BISULFATE 75 MG OR TABS
75.0000 mg | ORAL_TABLET | Freq: Every day | ORAL | 3 refills | Status: AC
Start: 2021-08-10 — End: 2022-08-10
  Filled 2021-08-10: qty 30, 30d supply, fill #0

## 2021-08-10 MED ORDER — SENNOSIDES 8.6 MG OR TABS
17.2000 mg | ORAL_TABLET | Freq: Two times a day (BID) | ORAL | Status: DC | PRN
Start: 2021-08-10 — End: 2021-08-11

## 2021-08-10 MED ORDER — DIPHENHYDRAMINE HCL 50 MG/ML IJ SOLN
INTRAMUSCULAR | Status: DC | PRN
Start: 2021-08-10 — End: 2021-08-10
  Administered 2021-08-10: 25 mg via INTRAVENOUS

## 2021-08-10 MED ORDER — MIDAZOLAM HCL (PF) 2 MG/2ML IJ SOLN
INTRAMUSCULAR | Status: AC
Start: 2021-08-10 — End: 2021-08-10
  Filled 2021-08-10: qty 4

## 2021-08-10 MED ORDER — IOHEXOL 350 MG/ML IV SOLN
INTRAVENOUS | Status: DC | PRN
Start: 2021-08-10 — End: 2021-08-10
  Administered 2021-08-10: 80 mL via INTRAVASCULAR

## 2021-08-10 MED ORDER — METOPROLOL SUCCINATE ER 25 MG OR TB24
25.0000 mg | EXTENDED_RELEASE_TABLET | Freq: Every day | ORAL | Status: DC
Start: 2021-08-11 — End: 2021-08-11
  Administered 2021-08-11: 25 mg via ORAL
  Filled 2021-08-10: qty 1

## 2021-08-10 MED ORDER — HEPARIN SODIUM (PORCINE) 1000 UNIT/ML IJ SOLN
INTRAMUSCULAR | Status: DC | PRN
Start: 2021-08-10 — End: 2021-08-10
  Administered 2021-08-10: 3000 [IU] via INTRAVENOUS
  Administered 2021-08-10: 7000 [IU] via INTRAVENOUS

## 2021-08-10 MED ORDER — LIDOCAINE HCL (PF) 1 % IJ SOLN
INTRAMUSCULAR | Status: DC | PRN
Start: 2021-08-10 — End: 2021-08-10
  Administered 2021-08-10: 5 mL via SUBCUTANEOUS

## 2021-08-10 MED ORDER — LIDOCAINE HCL 1 % IJ SOLN
INTRAMUSCULAR | Status: AC
Start: 2021-08-10 — End: 2021-08-10
  Filled 2021-08-10: qty 20

## 2021-08-10 MED ORDER — SODIUM CHLORIDE 0.9% IV BOLUS
250.0000 mL | Freq: Once | INTRAVENOUS | Status: DC | PRN
Start: 2021-08-10 — End: 2021-08-11

## 2021-08-10 MED ORDER — IOHEXOL 300 MG/ML IJ SOLN
INTRAMUSCULAR | Status: AC
Start: 2021-08-10 — End: 2021-08-10
  Filled 2021-08-10: qty 50

## 2021-08-10 MED ORDER — DIPHENHYDRAMINE HCL 50 MG/ML IJ SOLN
INTRAMUSCULAR | Status: AC
Start: 2021-08-10 — End: 2021-08-10
  Filled 2021-08-10: qty 1

## 2021-08-10 MED ORDER — SODIUM CHLORIDE 0.9 % IV SOLN
3.0000 mL/h | INTRAVENOUS | Status: DC
Start: 2021-08-10 — End: 2021-08-11

## 2021-08-10 MED ORDER — ONDANSETRON HCL 4 MG OR TABS
4.0000 mg | ORAL_TABLET | Freq: Three times a day (TID) | ORAL | Status: DC | PRN
Start: 2021-08-10 — End: 2021-08-11

## 2021-08-10 MED ORDER — NITROGLYCERIN 0.4 MG SL SUBL
0.4000 mg | SUBLINGUAL_TABLET | SUBLINGUAL | Status: DC | PRN
Start: 2021-08-10 — End: 2021-08-10

## 2021-08-10 MED ORDER — ASPIRIN 81 MG OR TBEC
DELAYED_RELEASE_TABLET | ORAL | Status: AC
Start: 2021-08-10 — End: 2021-08-10
  Filled 2021-08-10: qty 3

## 2021-08-10 MED ORDER — CLOPIDOGREL BISULFATE 75 MG OR TABS
75.0000 mg | ORAL_TABLET | Freq: Every day | ORAL | Status: DC
Start: 2021-08-11 — End: 2021-08-11
  Administered 2021-08-11: 75 mg via ORAL
  Filled 2021-08-10: qty 1

## 2021-08-10 MED ORDER — IOHEXOL 350 MG/ML IV SOLN
INTRAVENOUS | Status: AC
Start: 2021-08-10 — End: 2021-08-10
  Filled 2021-08-10: qty 200

## 2021-08-10 MED ORDER — CLOPIDOGREL BISULFATE 75 MG OR TABS
ORAL_TABLET | ORAL | Status: AC
Start: 2021-08-10 — End: 2021-08-10
  Filled 2021-08-10: qty 8

## 2021-08-10 MED ORDER — FENTANYL CITRATE (PF) 100 MCG/2ML IJ SOLN
INTRAMUSCULAR | Status: AC
Start: 2021-08-10 — End: 2021-08-10
  Filled 2021-08-10: qty 2

## 2021-08-10 MED ORDER — ASPIRIN 81 MG OR TBEC
81.0000 mg | DELAYED_RELEASE_TABLET | Freq: Every day | ORAL | Status: DC
Start: 2021-08-11 — End: 2021-08-11
  Administered 2021-08-11: 81 mg via ORAL
  Filled 2021-08-10: qty 1

## 2021-08-10 MED ORDER — SODIUM CHLORIDE 0.9 % IV SOLN
1.5000 mL/kg/h | INTRAVENOUS | Status: DC
Start: 2021-08-10 — End: 2021-08-10
  Administered 2021-08-10: 1.501 mL/kg/h via INTRAVENOUS

## 2021-08-10 MED ORDER — ONDANSETRON HCL 4 MG/2ML IJ SOLN
4.0000 mg | Freq: Three times a day (TID) | INTRAMUSCULAR | Status: DC | PRN
Start: 2021-08-10 — End: 2021-08-11

## 2021-08-10 MED ORDER — NITROGLYCERIN 200 MCG/ML 10 ML SYRINGE IN D5W (PROCEDURAL)
INTRAVENOUS | Status: AC
Start: 2021-08-10 — End: 2021-08-10
  Filled 2021-08-10: qty 10

## 2021-08-10 SURGICAL SUPPLY — 31 items
BALLOON APEX 2.5MM 20MM 142CM (Balloon) ×3
BALLOON APEX 2.5MM 20MM 142CM MFR DISCONTINUED (Balloon) ×2 IMPLANT
BALLOON APEX 2MM 20MM 142CM (Balloon) ×3 IMPLANT
BALLOON NC EUPHORA 3MM 20MM 142CM RX (Balloon) ×3 IMPLANT
BALLOON NC QUANTUM APEX 4.5MM 15MM 143CM (Balloon) ×3
BALLOON NC QUANTUM APEX 4.5MM 15MM 143CM MFR DISCONTINUED (Balloon) ×2 IMPLANT
CATHETER FIX TRAPPER 2FR EXCHANGE (Balloon) ×6 IMPLANT
CATHETER IVUS OPTICROSS HD 6FR (Catheter) ×3 IMPLANT
CATHETER SASUKE DUAL ACCESS .014IN X 145CM (Catheter) ×3 IMPLANT
DEVICE CLOSURE PERCLOSE PROSTYLE (Closure Device) ×3 IMPLANT
DEVICE COMPRESS TR BAND REG (Other) ×3 IMPLANT
DEVICE ULTRASOUND GALAXY IVUS SLED (Imaging Device) ×3 IMPLANT
DRAPE BRACHIAL/RADIAL (Drape) ×3 IMPLANT
GUIDE CATHETER LAUNCHER 7FR 100CM EBU4 (Cardiac Catheter / Guide) ×3 IMPLANT
GUIDE CATHETER LAUNCHER 7FR 90CM AL1 (Cardiac Catheter / Guide) ×3 IMPLANT
GUIDEWIRE ASAHI SION 190CM STRAIGHT (Interventional Guidewire) ×3 IMPLANT
GUIDEWIRE ASHAI SION BLUE 190CM STRAIGHT (Interventional Guidewire) ×3 IMPLANT
GUIDEWIRE HI-TORQUE VERSACORE MODIFIED J .035IN X 145CM (Guidewire) ×6 IMPLANT
GUIDEWIRE RUNTHROUGH NS .014 X 180CM (Interventional Guidewire) ×9 IMPLANT
INTRODUCER KIT GLIDESHEATH SLENDER 7FR 10CM SHEATH (Sheath) ×3 IMPLANT
KIT DRAPE RIGHT TABLE VERSION (Drape) ×3 IMPLANT
KIT MANIFOLD MTS LHK UW MEDICAL CTR (Other) ×1
KIT MANIFOLD MTS LHK ~~LOC~~ MEDICAL CTR (Other) ×2 IMPLANT
KIT MICRO INTRODUCER 5FR 10CM X 21G NEEDLE (Sheath) ×3 IMPLANT
MICROCATHETER CORSAIR PRO 135CM 2.6-2.8FR SPIRAL PROTECT (Catheter) ×3 IMPLANT
PACK CUST LEFT HEART ANGIO (Pack) ×3 IMPLANT
SHEATH INTRODUCER CORDIS 8FR 45CM (Sheath) ×3 IMPLANT
SHEATH INTRODUCER PINNACLE  6F X 10CM .038 W/GUIDEWIRE (Sheath) ×3 IMPLANT
STENT DES SYNERGY XD MR US 3.0 X 38MM (Drug Eluting Stent) ×3 IMPLANT
STENT DES SYNERGY XD MR US 3.5 X 28MM (Drug Eluting Stent) ×3 IMPLANT
VALVE HEMOSTASIS WATCHDOG KIT (Other) ×6 IMPLANT

## 2021-08-10 NOTE — Brief Op Note (Signed)
Immediate Brief Operative Note   Evan Trevino - DOB: 10/22/1960 (60 year old male) MRN: [id number]  Procedure Date: 08/10/2021      [facility]         PROCEDURE DETAILS    Procedure Team:  Primary: Serena Colonel, MD  Fellow - Assisting: Adonis Housekeeper, MD     Procedure(s):   PCI/CTO   Pre Procedure Diagnosis:  Stable angina pectoris due to arteriosclerosis of coronary artery [facility]) [I25.118]     Post Procedure Diagnosis:        * Stable angina pectoris due to arteriosclerosis of coronary artery ([place]) [I25.118]       PROCEDURE SUMMARY     Procedural Description: LAD CTO PCI    Description of Findings: 7Fr RRA s/p TR band, 8Fr RFA s/p perclose. Antegrade wire LAD s/p DES X2. PTCA diagonal ostium.     Estimated Blood Loss: < 100 cc    Specimens Removed: None    Post-operative Diagnosis: Obstructive CAD

## 2021-08-10 NOTE — Procedure Nursing Note (Signed)
ICRU RN Post Procedure Summary Note    Procedure Performed:  PCI/CTO    Patient Disposition: 4se    Ride Home with: na    Report Given to:  4se RN    Discharge / Transfer Criteria:    x  VSS/within 20% of baseline  x  Pt meets goal for pain relief  x  Pt meets goal for nausea relief  x  Return to baseline respiratory status/minimal FiO2 requirements if transferred  x  Return to baseline neuro status  x  Tolerated Liquids      Procedural Access Site Assessment:    Type / Location of Procedural Access Site: R groin & R radial  + Pulses  Final Evaluation of Procedural Access Site: D&I w/o hematoma    Discharge Education:    x  D/C education provided per appropriate [facility] approved document  x  Patient and/or escort verbalized understanding of teaching    Nursing Narrative: pt wife @ bedside all questions answered & support offered

## 2021-08-10 NOTE — Discharge Instructions (Addendum)
Potential Interventional Cardiologists closer to home to establish care with:   Roselyn Bering at [place] at [facility] in [place] at [facility] at [place]:  You had successful heart procedure on 08/10/21 with Dr. Delmar Landau, involving stent placement to your LAD (2 stents).    Appointments:  Please contact your primary cardiologist's office to coordinate a visit within one month of your hospital discharge.   Please establish local cardiology care as recommended above.     Medications:  We started you on Clopidogrel (Plavix) 75 mg daily. I sent the first prescription to our Outpatient Pharmacy. The rest of the prescriptions should go to your [facility] in [place].   Aspirin 81 mg and Clopidogrel 75 mg daily between for 6-12 months since your new stents.  Decrease your metoprolol to 12.5 mg daily (half-tab)     Wound care  You may shower 24hr post-procedure. No scrubbing at site; let soapy water run over incision and pat dry with clean towel. Avoid soaking in pool or tub for 14 days.  Do not leave a wet dressing on because that will put you at a higher risk of infection.   Keep the area clean and dry. You do not need to re-cover it. Do not apply any type of creams and/or ointments to the site.   Contact your cardiologist if you develop painful swelling, firmness, or bleeding at procedure incision sites.    Activity  An electronic referral for cardiac rehab has been completed and provided for you. Please call to schedule an appointment in 2-4 weeks at a location that is convenient for you.  No lifting, pushing, pulling anything more than 10 lbs for 7 days if we accessed your groin.  No lifting, pushing, pulling anything more than 5 lbs for 7 days if we accessed your wrist.  Avoid straining with bowel movements for the next week.   Gradually resume your normal activities over the next 7 days. Gentle walking is ok.   If you should cough, laugh, sneeze and or  strain in anyway place the flats of your 3 middle fingers over the site and apply moderate pressure.

## 2021-08-10 NOTE — Procedure Nursing Note (Signed)
ICRU RN Pre Procedure Summary Note    Planned Procedure:  PCI/CTO    Planned disposition:  x  TBD    Pt prepped per orders and standards of care in ICRU.  See pre procedure documentation for details of pt's prep.    Pre procedure education provided verbally with pt and their family member.  All questions answered.    Nursing Narrative pt wife @ bedside

## 2021-08-10 NOTE — H&P (Signed)
History and Physical     Evan Trevino ("Evan Trevino") - DOB: 12/03/1960 (60 year old male)  Gender Identity: Male  Preferred Pronouns: he/him/his  PCP: No primary care provider on file.   Code Status: Full Code       CHIEF CONCERN / IDENTIFICATION:     Evan Trevino is a 60 year old male with a history of follicular lymphoma and CAD who presents today for PCI to LAD CTO.    SUBJECTIVE   HISTORY OF PRESENT ILLNESS:   Per HPI by Dr. Condos 08/09/21:  He is a very fit 60 year old male who enjoys biking and has noticed a progressive decline in his endurance over the last couple of years.  He has a history of follicular lymphoma in remission and had a surveillance PET scan that demonstrated heavy coronary calcium.  He eventually underwent a stress test that was positive and referred for angiogram.  Cath in 2021 demonstrated the LAD CTO and he has been medically managed since.     Unfortunately he has had side effects from the metoprolol and poor control of his symptoms.  He has gradually declined in his ability to bike and is not content with his current functional status.  He is interested and eager to hear about other approaches.    Underwent successful PCI to LAD CTO x2 stents today with Drs. Lombardi and Tiwana. The procedure today was uncomplicated, and the patient tolerated moderate sedation without reported periods of apnea, hypotension or sustained arrhythmia. Access: RRA, RFA.   Please see procedure note for full details.    Post-procedure patient transferred to ICRU in stable condition. Upon evaluation at the bedside, the patient is found in no acute distress. He otherwise denies chest pain, shortness of breath, lightheadedness, dizziness, palpitations, nausea, headache or vision changes.        Review of Systems  I have performed a complete review of systems with the patient, which is negative except as indicated per HPI.      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    Social History     Tobacco Use   • Smoking status: Never   • Smokeless tobacco: Never   Substance and Sexual Activity   • Alcohol use: Yes     Alcohol/week: 1.0 standard drink     Types: 1 Glasses of wine per week   • Drug use: Never       No family history on file.       OUTPATIENT MEDICATIONS:   Current Outpatient Medications   Medication Instructions   • aspirin 81 mg, Oral, Daily   • cholecalciferol (VITAMIN D-3) 5,000 units, Oral, Daily   • cromolyn 5.2 MG/ACT nasal spray 1 spray, Nasal, As needed   • Magnesium Gluconate 30 mg, Oral, Daily PRN   • metoprolol succinate ER (TOPROL XL) 25 mg, Oral, Daily   • Multiple Vitamin (Quintabs) tablet 1 tablet, Oral   • nitroGLYCERIN (NITROSTAT) 0.4 mg, Sublingual, Every 5 min PRN, Max 3 doses per 15 min. If no relief, call 911.   • Quercetin Dihydrate powder Other, Daily   • Saw   Palmetto 500 mg, Oral, Daily       ALLERGIES:   Patient has no known allergies.      OBJECTIVE     Vitals (Arrival)      T: 36.2 °C (08/10/21 1305)  BP: 119/76 (08/10/21 1305)  HR: (!) 54 (08/10/21 1305)  RR: 18 (08/10/21 1305)  SpO2: 100 % (08/10/21 1305) Room air   Vitals (Most recent in last 24 hrs)   T: 36.2 °C (08/10/21 1305)  BP: 119/76 (08/10/21 1305)  HR: (!) 54 (08/10/21 1305)  RR: 18 (08/10/21 1305)  SpO2: 100 % (08/10/21 1305) Room air  T range: Temp  Min: 36.2 °C  Max: 36.2 °C  Wt 169 lb (76.658 kg)     Ht 5' 8" (1.727 m)     Body mass index is 25.7 kg/m².       Physical Exam  Constitutional: alert, in no acute distress  Eyes: extra ocular movements intact, anicteric  ENMT: moist mucous membranes  Cardiovascular: rate and rhythm regular, no murmur, no rub/gallop, no JVD  bilat radial pulses 2+, bilat posterior tibial pulses 2+, bilat pedal pulses  2+  no edema bilat LE  Respiratory: CTA bilateral lung fields, no wheezing/rales/rhonchi  GI: +BS, soft, non-tender  Neuro: alert and oriented x3, no focal cranial deficits, sensation intact to light touch BLE and BUE  Musculoskeletal: full ROM on bilateral UE, limited ROM on bilateral LE after groin puncture  Skin: warm, dry  right radial access with TR band dressing CDI, volar forearm soft and non-tender, distal cap refill <3sec  right groin access tegaderm dressing CDI soft and without induration or oozing  Psychiatric: appropriate mood and affect, cooperative      Labs (last 24 hours):   Chemistries  CBC  LFT  Gases, other   142 107 12 110   13.9   AST: - ALT: -  -/-/-/-   -/-/-/-   4.7 26 1.06   5.31 >< 146  AP: - T bili: -  Lact (a): - Lact (v): -   eGFR: >60 Ca: 9.0   39   Prot: - Alb: -  Trop I: - D-dimer: -   Mg: - PO4: -  ANC: -     BNP: - Anti-Xa: -     ALC: -    INR: 1.2           Reviewed Results? Independently visualized & interpreted? Key Findings     Lab [x] [x] WDL   Radiology [] []    EKG/Tele/Echo  [x] [x] SB                 ASSESSMENT/PLAN      Evan Trevino is a 60 year old male with h/o CAD s/p PCI to LAD CTOx 2 stents today, admission for overnight outpatient observation.    #CAD s/p PCI  #HLD  Previously diagnosed with CAD and now successful PCI to LAD CTO with Dr Lombardi. No complications. RRA, RFA access without induration or oozing. VSS. Euvolemic on exam and satting well on RA. Post-procedure EKG notes SB    --AM labs and EKG ordered  --ASA and clopidogrel in AM  --metoprolol 25 mg QD    --Activity and vitals monitoring per orders  --No driving for 48hr post procedure  --Ok to shower and remove dressing 24-48hr post-procedure  --5 lb weight limit for radial access on affected extremity  --10 lb weight limit for one week for femoral access  --No soaking in   in tub or pool for at least 2wk post procedure  --Follow up with primary cardiologist 2-4 weeks  --Cardiac rehab referral ordered and  discussed with patient to schedule independently in 15mo at a location that is convenient for them    EVIDENCE BASED MEDICATION FOR SECONDARY PREVENTION:   DAPT prescribed? Yes, aspirin, plavix  Statin prescribed? Not priorly on statins    Inpatient Checklist:  {Vanishing Tip   LDA Avatar :999}  Fluids/Electrolytes/Nutrition: heart healthy  Prophylaxis: SCDs  Lines/Drains/Airways: PIV  Disposition: floor care   Code Status: Full Code    Contacts: Primary Emergency Contact: [place] Home Phone: [phone]

## 2021-08-11 ENCOUNTER — Other Ambulatory Visit (HOSPITAL_BASED_OUTPATIENT_CLINIC_OR_DEPARTMENT_OTHER): Payer: Self-pay

## 2021-08-11 LAB — COVID-19 CORONAVIRUS QUALITATIVE PCR: COVID-19 Coronavirus Qual PCR Result: NOT DETECTED

## 2021-08-11 LAB — CBC (HEMOGRAM)
Hematocrit: 41 % (ref 38.0–50.0)
Hemoglobin: 14.2 g/dL (ref 13.0–18.0)
MCH: 31.6 pg (ref 27.3–33.6)
MCHC: 34.7 g/dL (ref 32.2–36.5)
MCV: 91 fL (ref 81–98)
Platelet Count: 150 10*3/uL (ref 150–400)
RBC: 4.5 10*6/uL (ref 4.40–5.60)
RDW-CV: 12.5 % (ref 11.0–14.5)
WBC: 6.47 10*3/uL (ref 4.3–10.0)

## 2021-08-11 LAB — BASIC METABOLIC PANEL
Anion Gap: 8 (ref 4–12)
Calcium: 8.8 mg/dL — ABNORMAL LOW (ref 8.9–10.2)
Carbon Dioxide, Total: 26 meq/L (ref 22–32)
Chloride: 107 meq/L (ref 98–108)
Creatinine: 1.01 mg/dL (ref 0.51–1.18)
Glucose: 93 mg/dL (ref 62–125)
Potassium: 3.6 meq/L (ref 3.6–5.2)
Sodium: 141 meq/L (ref 135–145)
Urea Nitrogen: 12 mg/dL (ref 8–21)
eGFR by CKD-EPI 2021: >60 mL/min/{1.73_m2} (ref >59)

## 2021-08-11 MED ORDER — METOPROLOL SUCCINATE ER 25 MG OR TB24
12.5000 mg | EXTENDED_RELEASE_TABLET | Freq: Every day | ORAL | Status: AC
Start: 2021-08-11 — End: ?

## 2021-08-11 MED ORDER — POTASSIUM CHLORIDE CRYS ER 20 MEQ OR TBCR
20.0000 meq | EXTENDED_RELEASE_TABLET | Freq: Once | ORAL | Status: AC
Start: 2021-08-11 — End: 2021-08-11
  Administered 2021-08-11: 20 meq via ORAL
  Filled 2021-08-11: qty 1

## 2021-08-11 NOTE — Nursing Note (Signed)
Illness Severity  Stable      Patient Summary  60 year old male with a history of CAD who presents to [facility] for consideration of LAD CTO PCI. S/P successful PCI to LAD CTO x2 stents 12/30, via rt radial and rt femoral.    - A/O x 4, Tele: SB w/ occas bigeminy, VSS, afebrile, on RA   - CPAP at night  - Rt radial site C/D/I, no hematoma, bruising, +pulses, +CMS, tegaderm   - Rt femoral site, gauze/tegaderm C/D/I, +CMS, +pulses, no hematoma or bruising noted, tender to the touch  - Ambulated the hallways SBA w/ no issues  - Voiding w/o difficulty   - Tolerating PO intake, denies nausea  - Denies pain    Action Items  - IV discontinued  - K+ replaced  - Discharged per orders    Situational Awareness  Ambulatory discharge performed around 1030. Education completed, questions answered, and IV discontinued. Patient to pick up Rx on the way out of the hospital. Provided patient w/ medical device cards. Patient acknowledges knowledge of follow up appointments.

## 2021-08-11 NOTE — Nursing Note (Signed)
Illness Severity  Stable      Patient Summary  60 year old male with a history of CAD who presents to [facility] for consideration of LAD CTO PCI. S/P successful PCI to LAD CTO x2 stents 12/30, via rt radial and rt femoral.    - A/O x 4, Tele: SB w/ occas bigeminy, VSS, afebrile, on RA   - CPAP at night  - Rt radial site C/D/I, no hematoma, bruising, +pulses, +CMS, tegaderm   - Rt femoral site, gauze/tegaderm C/D/I, +CMS, +pulses, no hematoma or bruising noted, tender to the touch  - Ambulated in room   - Voiding w/o difficulty   - Tolerating PO intake, denies nausea  - Denies pain      Action Items  - CTM groin and radial sites, tele  - Rt arm limb restrictions

## 2021-08-14 LAB — EKG 12 LEAD
Atrial Rate: 58 {beats}/min
Atrial Rate: 51 {beats}/min
P Axis: 10 degrees
P Axis: 49 degrees
P-R Interval: 166 ms
P-R Interval: 158 ms
Q-T Interval: 430 ms
Q-T Interval: 452 ms
QRS Duration: 92 ms
QRS Duration: 90 ms
QTC Calculation: 422 ms
QTC Calculation: 416 ms
R Axis: 14 degrees
R Axis: 22 degrees
T Axis: 35 degrees
T Axis: 47 degrees
Ventricular Rate: 58 {beats}/min
Ventricular Rate: 51 {beats}/min

## 2021-08-15 LAB — ACT LOW RANGE (POC), ~~LOC~~
ACT Low Range (POC): 254 s
ACT Low Range (POC): 304 s
ACT Low Range (POC): 273 s
ACT Low Range (POC): >400 s

## 2021-08-16 ENCOUNTER — Other Ambulatory Visit (HOSPITAL_BASED_OUTPATIENT_CLINIC_OR_DEPARTMENT_OTHER): Payer: Self-pay

## 2021-08-16 LAB — EKG 12 LEAD
Atrial Rate: 48 {beats}/min
P Axis: -4 degrees
P-R Interval: 162 ms
Q-T Interval: 474 ms
QRS Duration: 92 ms
QTC Calculation: 423 ms
R Axis: 1 degrees
T Axis: 31 degrees
Ventricular Rate: 48 {beats}/min

## 2021-12-10 ENCOUNTER — Emergency Department: Payer: Self-pay

## 2022-01-06 ENCOUNTER — Emergency Department: Payer: Self-pay

## 2022-02-16 IMAGING — DX DG CHEST 2V
2 series · 2 of 2 positions shown · non-contrast
Comparison: 05/03/2008

CLINICAL DATA: Dizziness, left-sided chest pressure since this
morning, nausea

EXAM:
CHEST - 2 VIEW

[chest pa]
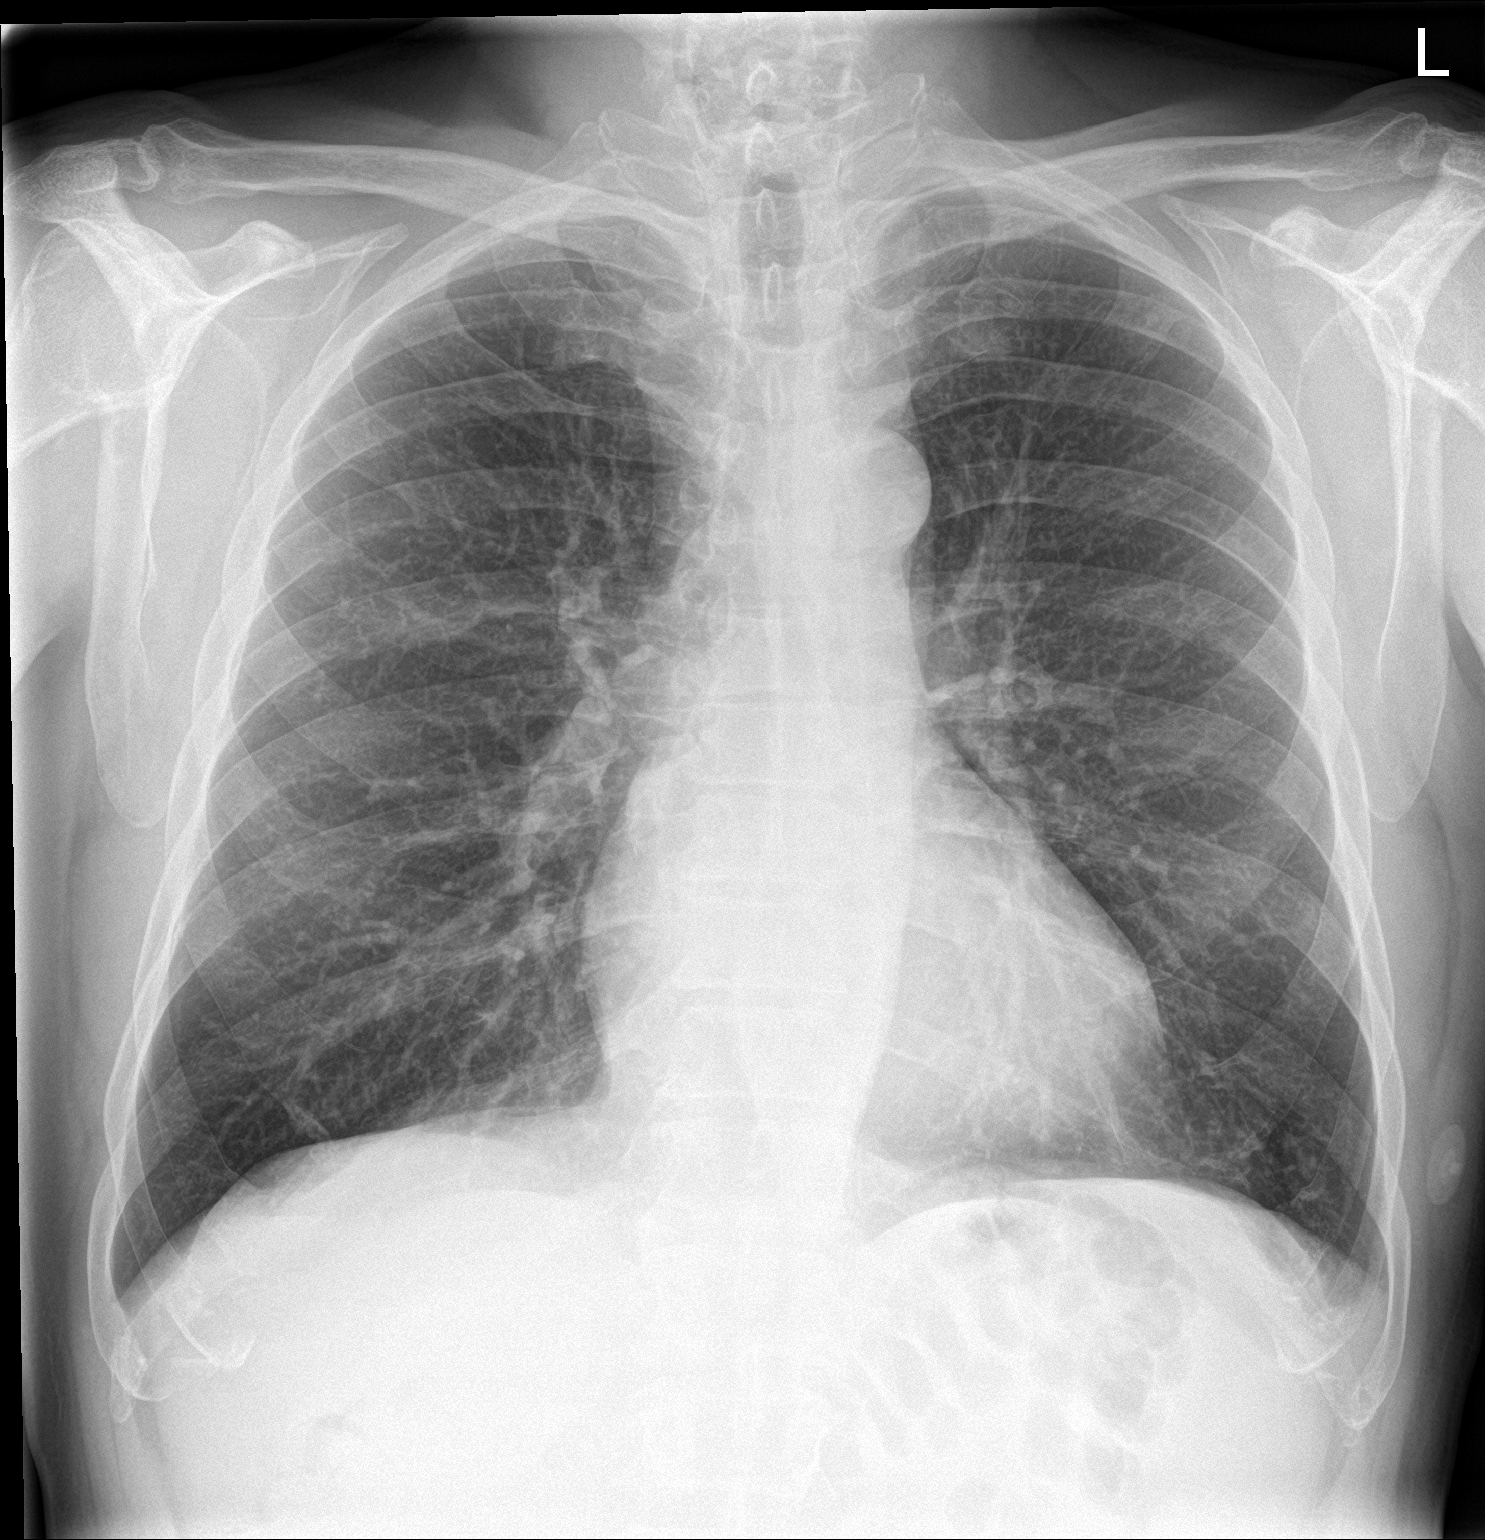

[chest lat]
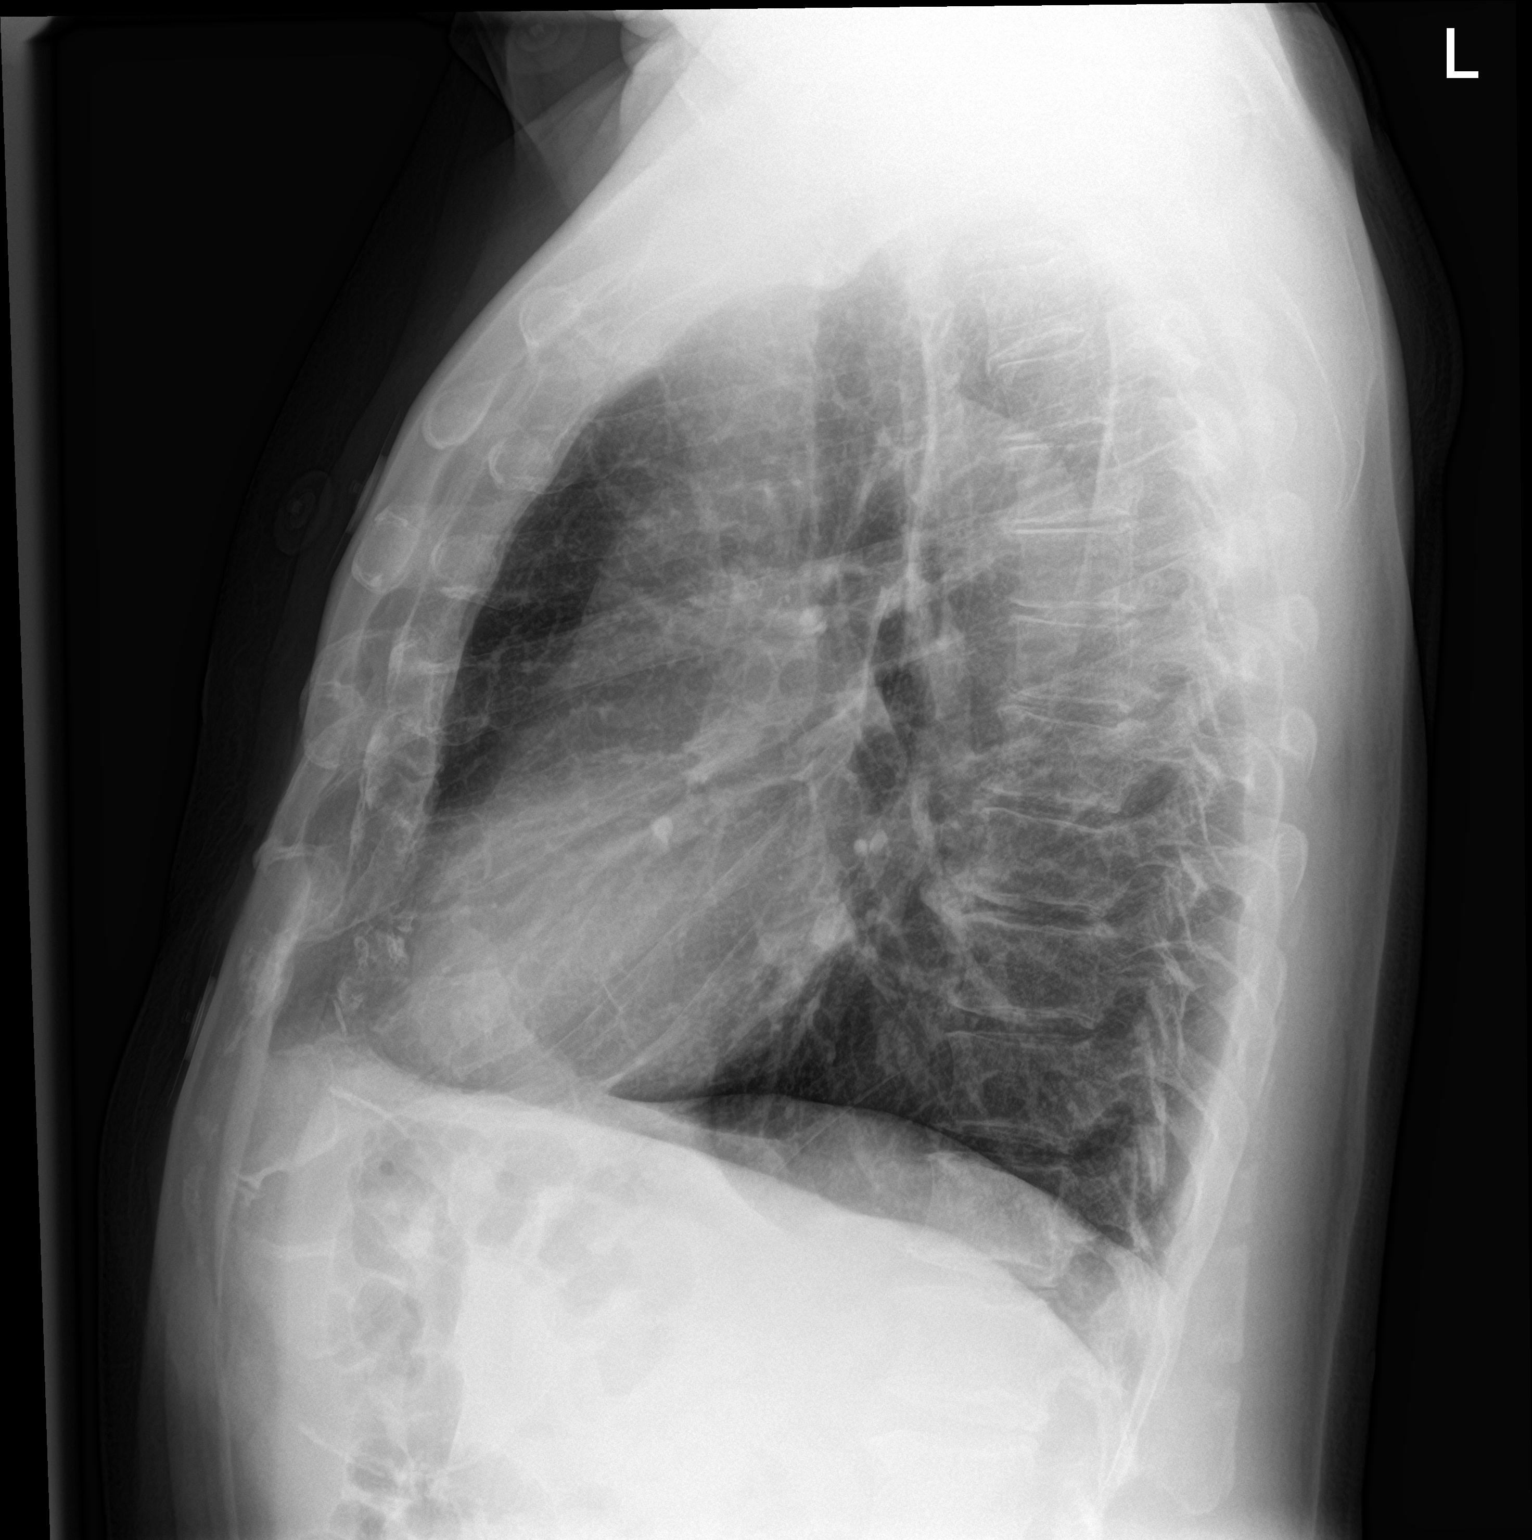

[2 of 2 positions shown; findings below may reference images not displayed]

FINDINGS: The heart size and mediastinal contours are within normal limits.
Both lungs are clear. The visualized skeletal structures are
unremarkable.
IMPRESSION: No active cardiopulmonary disease.
